# Patient Record
Sex: Male | Born: 1960 | Race: White | Hispanic: No | Marital: Married | Smoking: Former smoker
Health system: Southern US, Community
[De-identification: ages and names within clinical notes are randomized; demographics above are authoritative.]

## PROBLEM LIST (undated history)

## (undated) DIAGNOSIS — M549 Dorsalgia, unspecified: Secondary | ICD-10-CM

## (undated) DIAGNOSIS — M542 Cervicalgia: Secondary | ICD-10-CM

## (undated) DIAGNOSIS — F419 Anxiety disorder, unspecified: Secondary | ICD-10-CM

## (undated) DIAGNOSIS — G8929 Other chronic pain: Secondary | ICD-10-CM

## (undated) HISTORY — PX: TONSILLECTOMY: SUR1361

## (undated) HISTORY — PX: OTHER SURGICAL HISTORY: SHX169

## (undated) HISTORY — PX: ADENOIDECTOMY: SUR15

---

## 1998-11-14 ENCOUNTER — Ambulatory Visit (HOSPITAL_COMMUNITY): Admission: RE | Admit: 1998-11-14 | Discharge: 1998-11-14 | Payer: Self-pay | Admitting: Neurosurgery

## 2015-07-22 ENCOUNTER — Encounter (HOSPITAL_COMMUNITY): Payer: Self-pay | Admitting: *Deleted

## 2015-07-22 ENCOUNTER — Emergency Department (HOSPITAL_COMMUNITY)
Admission: EM | Admit: 2015-07-22 | Discharge: 2015-07-22 | Disposition: A | Discharge status: Home / Self Care | Payer: Self-pay | Attending: Emergency Medicine | Admitting: Emergency Medicine

## 2015-07-22 ENCOUNTER — Emergency Department (HOSPITAL_COMMUNITY): Payer: Self-pay

## 2015-07-22 DIAGNOSIS — Z79899 Other long term (current) drug therapy: Secondary | ICD-10-CM | POA: Insufficient documentation

## 2015-07-22 DIAGNOSIS — Y9389 Activity, other specified: Secondary | ICD-10-CM | POA: Insufficient documentation

## 2015-07-22 DIAGNOSIS — G8929 Other chronic pain: Secondary | ICD-10-CM | POA: Insufficient documentation

## 2015-07-22 DIAGNOSIS — Y92410 Unspecified street and highway as the place of occurrence of the external cause: Secondary | ICD-10-CM | POA: Insufficient documentation

## 2015-07-22 DIAGNOSIS — F419 Anxiety disorder, unspecified: Secondary | ICD-10-CM | POA: Insufficient documentation

## 2015-07-22 DIAGNOSIS — S29001A Unspecified injury of muscle and tendon of front wall of thorax, initial encounter: Secondary | ICD-10-CM | POA: Insufficient documentation

## 2015-07-22 DIAGNOSIS — R0789 Other chest pain: Secondary | ICD-10-CM

## 2015-07-22 DIAGNOSIS — Y998 Other external cause status: Secondary | ICD-10-CM | POA: Insufficient documentation

## 2015-07-22 HISTORY — DX: Other chronic pain: G89.29

## 2015-07-22 HISTORY — DX: Anxiety disorder, unspecified: F41.9

## 2015-07-22 HISTORY — DX: Dorsalgia, unspecified: M54.9

## 2015-07-22 HISTORY — DX: Cervicalgia: M54.2

## 2015-07-22 MED ORDER — ACETAMINOPHEN 500 MG PO TABS
1000.0000 mg | ORAL_TABLET | Freq: Once | ORAL | Status: AC
  Administered 2015-07-22: 1000 mg via ORAL
  Filled 2015-07-22: qty 2

## 2015-07-22 MED ORDER — IBUPROFEN 800 MG PO TABS
800.0000 mg | ORAL_TABLET | Freq: Once | ORAL | Status: AC
  Administered 2015-07-22: 800 mg via ORAL
  Filled 2015-07-22: qty 1

## 2015-07-22 MED ORDER — OXYCODONE HCL 5 MG PO TABS
5.0000 mg | ORAL_TABLET | Freq: Once | ORAL | Status: AC
  Administered 2015-07-22: 5 mg via ORAL
  Filled 2015-07-22: qty 1

## 2015-07-22 MED ORDER — OXYCODONE HCL 5 MG PO TABS: 5.0000 mg | ORAL_TABLET | ORAL | Status: AC | PRN
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
## 2015-07-22 NOTE — Discharge Instructions (Signed)
Take 4 over the counter ibuprofen tablets 3 times a day or 2 over-the-counter naproxen tablets twice a day for pain. ° °Chest Wall Pain °Chest wall pain is pain in or around the bones and muscles of your chest. Sometimes, an injury causes this pain. Sometimes, the cause may not be known. This pain may take several weeks or longer to get better. °HOME CARE °Pay attention to any changes in your symptoms. Take these actions to help with your pain: °· Rest as told by your doctor. °· Avoid activities that cause pain. Try not to use your chest, belly (abdominal), or side muscles to lift heavy things. °· If directed, apply ice to the painful area: °¨ Put ice in a plastic bag. °¨ Place a towel between your skin and the bag. °¨ Leave the ice on for 20 minutes, 2-3 times per day. °· Take over-the-counter and prescription medicines only as told by your doctor. °· Do not use tobacco products, including cigarettes, chewing tobacco, and e-cigarettes. If you need help quitting, ask your doctor. °· Keep all follow-up visits as told by your doctor. This is important. °GET HELP IF: °· You have a fever. °· Your chest pain gets worse. °· You have new symptoms. °GET HELP RIGHT AWAY IF: °· You feel sick to your stomach (nauseous) or you throw up (vomit). °· You feel sweaty or light-headed. °· You have a cough with phlegm (sputum) or you cough up blood. °· You are short of breath. °  °This information is not intended to replace advice given to you by your health care provider. Make sure you discuss any questions you have with your health care provider. °  °Document Released: 03/08/2008 Document Revised: 06/11/2015 Document Reviewed: 12/16/2014 °Elsevier Interactive Patient Education ©2016 [employer] ° °
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
## 2018-01-02 DEATH — deceased
